# Patient Record
Sex: Male | Born: 2000 | Race: Black or African American | Hispanic: No | Marital: Single | Smoking: Never smoker
Health system: Southern US, Community
[De-identification: ages and names within clinical notes are randomized; demographics above are authoritative.]

## PROBLEM LIST (undated history)

## (undated) DIAGNOSIS — Z9109 Other allergy status, other than to drugs and biological substances: Secondary | ICD-10-CM

## (undated) DIAGNOSIS — T7840XA Allergy, unspecified, initial encounter: Secondary | ICD-10-CM

## (undated) HISTORY — DX: Allergy, unspecified, initial encounter: T78.40XA

---
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
## 2019-10-20 ENCOUNTER — Other Ambulatory Visit: Payer: Self-pay

## 2019-10-20 ENCOUNTER — Ambulatory Visit
Admission: EM | Admit: 2019-10-20 | Discharge: 2019-10-20 | Disposition: A | Discharge status: Home / Self Care | Payer: 59 | Attending: Emergency Medicine | Admitting: Emergency Medicine

## 2019-10-20 DIAGNOSIS — Z113 Encounter for screening for infections with a predominantly sexual mode of transmission: Secondary | ICD-10-CM | POA: Insufficient documentation

## 2019-10-20 NOTE — ED Triage Notes (Signed)
Pt had unprotected sex yesterday and is requesting STD testing

## 2019-10-20 NOTE — Discharge Instructions (Addendum)
Penile self swab or Urine cytology obtained  Take medications as prescribed and to completion We will follow up with you regarding the results of your test If tests are positive, please abstain from sexual activity until you and your partner(s) are treated Follow up with PCP or [facility] if symptoms persists Return here or go to ER if you have any new or worsening symptoms

## 2019-10-20 NOTE — ED Provider Notes (Addendum)
[facility]   [id number] 10/20/19 Arrival Time: 1300   BJ:[id number] FOR STD  SUBJECTIVE:  Jake Gardner is a 19 y.o. male who presents requesting STI screening.  Currently asymptomatic.  Partner asymptomatic.  Last unprotected sexual encounter 10/19/2019.  Sexually active with 1 male partner.  Denies similar symptoms in the past.  Denies fever, chills, nausea, vomiting, abdominal or pelvic pain, urinary symptoms, penile discharge, rash or lesions.    No LMP for male patient.  ROS: As per HPI.  All other pertinent ROS negative.     Past Medical History:  Diagnosis Date  . Allergy   . Environmental allergies    History reviewed. No pertinent surgical history. No Known Allergies No current facility-administered medications on file prior to encounter.   No current outpatient medications on file prior to encounter.   Social History   Socioeconomic History  . Marital status: Single    Spouse name: Not on file  . Number of children: Not on file  . Years of education: Not on file  . Highest education level: Not on file  Occupational History  . Not on file  Tobacco Use  . Smoking status: Never Smoker  Substance and Sexual Activity  . Alcohol use: No  . Drug use: No  . Sexual activity: Never  Other Topics Concern  . Not on file  Social History Narrative   Lives with parents, rising freshman at [employer]. Likes to swim and play games on computer. Has best friend Dorothea Ogle but mostly sees him at school. Does not like to invite friends to his house, would rather go to their house. Mom allows him to go to friends' house but not much other freedom.   CRAFFT NEG -- denies every riding in a car with someone who has been drinking.   Social Determinants of Health   Financial Resource Strain:   . Difficulty of Paying Living Expenses:   Food Insecurity:   . Worried About Charity fundraiser in the Last Year:   . Arboriculturist in the Last Year:   Transportation  Needs:   . Film/video editor (Medical):   Marland Kitchen Lack of Transportation (Non-Medical):   Physical Activity:   . Days of Exercise per Week:   . Minutes of Exercise per Session:   Stress:   . Feeling of Stress :   Social Connections:   . Frequency of Communication with Friends and Family:   . Frequency of Social Gatherings with Friends and Family:   . Attends Religious Services:   . Active Member of Clubs or Organizations:   . Attends Archivist Meetings:   Marland Kitchen Marital Status:   Intimate Partner Violence:   . Fear of Current or Ex-Partner:   . Emotionally Abused:   Marland Kitchen Physically Abused:   . Sexually Abused:    Family History  Problem Relation Age of Onset  . Cancer Maternal Grandmother        uterine  . Hypertension Maternal Grandmother   . Heart disease Maternal Grandmother   . Cancer Maternal Grandfather        colon  . Multiple sclerosis Paternal Grandmother   . Diabetes Paternal Grandfather   . Alcohol abuse Paternal Grandfather   . Asthma Neg Hx   . Depression Neg Hx   . Hyperlipidemia Neg Hx   . Mental illness Neg Hx   . Learning disabilities Neg Hx   . Stroke Neg Hx     OBJECTIVE:  Vitals:   10/20/19 1314  BP: (!) 147/73  Pulse: (!) 102  Resp: 16  Temp: 98 F (36.7 C)  SpO2: 98%     General appearance: alert, NAD, appears stated age Head: NCAT Throat: lips, mucosa, and tongue normal; teeth and gums normal Lungs: CTA bilaterally without adventitious breath sounds Heart: regular rate and rhythm.  Radial pulses 2+ symmetrical bilaterally Back: no CVA tenderness Abdomen: soft, non-tender; bowel sounds normal; no masses or organomegaly; no guarding or rebound tenderness GU: Penile self swab was completed Skin: warm and dry Psychological:  Alert and cooperative. Normal mood and affect.  LABS:  Results for orders placed or performed in visit on 11/23/14  GC/chlamydia probe amp, urine  Result Value Ref Range   Chlamydia, Swab/Urine, PCR NEGATIVE  NEGATIVE   GC Probe Amp, Urine NEGATIVE NEGATIVE    Labs Reviewed - No data to display  ASSESSMENT & PLAN:  1. Screening for STD (sexually transmitted disease)     No orders of the defined types were placed in this encounter.   Pending: Labs Reviewed - No data to display  Discharge instruction Penile self swab or Urine cytology obtained  Take medications as prescribed and to completion We will follow up with you regarding the results of your test If tests are positive, please abstain from sexual activity until you and your partner(s) are treated Follow up with PCP or [facility] if symptoms persists Return here or go to ER if you have any new or worsening symptoms    Reviewed expectations re: course of current medical issues. Questions answered. Outlined signs and symptoms indicating need for more acute intervention. Patient verbalized understanding. After Visit Summary given.       Durward Parcel, FNP 10/20/19 1350    Durward Parcel, FNP 10/20/19 1353

## 2019-10-21 LAB — RPR: RPR Ser Ql: NONREACTIVE

## 2019-10-21 LAB — CYTOLOGY, (ORAL, ANAL, URETHRAL) ANCILLARY ONLY
Chlamydia: NEGATIVE
Comment: NEGATIVE
Comment: NEGATIVE
Comment: NORMAL
Neisseria Gonorrhea: NEGATIVE
Trichomonas: NEGATIVE

## 2019-10-21 LAB — HIV ANTIBODY (ROUTINE TESTING W REFLEX): HIV Screen 4th Generation wRfx: NONREACTIVE

## 2020-05-11 ENCOUNTER — Emergency Department
Admission: EM | Admit: 2020-05-11 | Discharge: 2020-05-11 | Disposition: A | Discharge status: Home / Self Care | Payer: 59 | Attending: Student in an Organized Health Care Education/Training Program | Admitting: Student in an Organized Health Care Education/Training Program

## 2020-05-11 ENCOUNTER — Emergency Department: Payer: 59

## 2020-05-11 ENCOUNTER — Other Ambulatory Visit: Payer: Self-pay

## 2020-05-11 DIAGNOSIS — R519 Headache, unspecified: Secondary | ICD-10-CM | POA: Insufficient documentation

## 2020-05-11 MED ORDER — MELOXICAM 15 MG PO TABS: 15.0000 mg | ORAL_TABLET | Freq: Every day | ORAL | 2 refills | Status: DC

## 2020-05-11 MED ORDER — METHOCARBAMOL 500 MG PO TABS: 500.0000 mg | ORAL_TABLET | Freq: Three times a day (TID) | ORAL | 0 refills | Status: AC | PRN

## 2020-05-11 NOTE — Discharge Instructions (Signed)
Take Meloxicam and Robaxin as directed.  
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
## 2020-08-03 DIAGNOSIS — Z419 Encounter for procedure for purposes other than remedying health state, unspecified: Secondary | ICD-10-CM | POA: Diagnosis not present

## 2020-09-03 DIAGNOSIS — Z419 Encounter for procedure for purposes other than remedying health state, unspecified: Secondary | ICD-10-CM | POA: Diagnosis not present

## 2020-10-03 DIAGNOSIS — Z419 Encounter for procedure for purposes other than remedying health state, unspecified: Secondary | ICD-10-CM | POA: Diagnosis not present

## 2020-11-03 DIAGNOSIS — Z419 Encounter for procedure for purposes other than remedying health state, unspecified: Secondary | ICD-10-CM | POA: Diagnosis not present

## 2020-12-03 DIAGNOSIS — Z419 Encounter for procedure for purposes other than remedying health state, unspecified: Secondary | ICD-10-CM | POA: Diagnosis not present

## 2021-01-03 DIAGNOSIS — Z419 Encounter for procedure for purposes other than remedying health state, unspecified: Secondary | ICD-10-CM | POA: Diagnosis not present

## 2021-02-03 DIAGNOSIS — Z419 Encounter for procedure for purposes other than remedying health state, unspecified: Secondary | ICD-10-CM | POA: Diagnosis not present

## 2021-03-05 DIAGNOSIS — Z419 Encounter for procedure for purposes other than remedying health state, unspecified: Secondary | ICD-10-CM | POA: Diagnosis not present

## 2021-03-15 ENCOUNTER — Encounter: Payer: Self-pay | Admitting: Emergency Medicine

## 2021-03-15 ENCOUNTER — Ambulatory Visit
Admission: EM | Admit: 2021-03-15 | Discharge: 2021-03-15 | Disposition: A | Discharge status: Home / Self Care | Payer: Medicaid Other | Attending: Internal Medicine | Admitting: Internal Medicine

## 2021-03-15 ENCOUNTER — Other Ambulatory Visit: Payer: Self-pay

## 2021-03-15 DIAGNOSIS — Z113 Encounter for screening for infections with a predominantly sexual mode of transmission: Secondary | ICD-10-CM | POA: Diagnosis not present

## 2021-03-15 NOTE — ED Provider Notes (Signed)
[facility]    CSN: [id number] Arrival date & time: 03/15/21  1751      History   Chief Complaint No chief complaint on file.   HPI Jake Gardner is a 20 y.o. male comes to the urgent care for STD screening.  Patient has no symptoms.  Patient has multiple sexual partners.  HPI  Past Medical History:  Diagnosis Date   Allergy    Environmental allergies     Patient Active Problem List   Diagnosis Date Noted   Allergic rhinitis 11/17/2013    History reviewed. No pertinent surgical history.     Home Medications    Prior to Admission medications   Not on File    Family History Family History  Problem Relation Age of Onset   Cancer Maternal Grandmother        uterine   Hypertension Maternal Grandmother    Heart disease Maternal Grandmother    Cancer Maternal Grandfather        colon   Multiple sclerosis Paternal Grandmother    Diabetes Paternal Grandfather    Alcohol abuse Paternal Grandfather    Asthma Neg Hx    Depression Neg Hx    Hyperlipidemia Neg Hx    Mental illness Neg Hx    Learning disabilities Neg Hx    Stroke Neg Hx     Social History Social History   Tobacco Use   Smoking status: Never   Smokeless tobacco: Never  Substance Use Topics   Alcohol use: No   Drug use: No     Allergies   Patient has no known allergies.   Review of Systems Review of Systems  All other systems reviewed and are negative.   Physical Exam Triage Vital Signs ED Triage Vitals  Enc Vitals Group     BP 03/15/21 1856 120/69     Pulse Rate 03/15/21 1856 88     Resp 03/15/21 1856 18     Temp 03/15/21 1856 98.9 F (37.2 C)     Temp Source 03/15/21 1856 Oral     SpO2 03/15/21 1856 96 %     Weight --      Height --      Head Circumference --      Peak Flow --      Pain Score 03/15/21 1855 0     Pain Loc --      Pain Edu? --      Excl. in GC? --    No data found.  Updated Vital Signs BP 120/69 (BP Location: Right Arm)   Pulse  88   Temp 98.9 F (37.2 C) (Oral)   Resp 18   SpO2 96%   Visual Acuity Right Eye Distance:   Left Eye Distance:   Bilateral Distance:    Right Eye Near:   Left Eye Near:    Bilateral Near:     Physical Exam Vitals and nursing note reviewed.  Constitutional:      General: He is not in acute distress.    Appearance: Normal appearance. He is not ill-appearing.  HENT:     Right Ear: Tympanic membrane normal.     Left Ear: Tympanic membrane normal.  Cardiovascular:     Rate and Rhythm: Normal rate and regular rhythm.  Pulmonary:     Effort: Pulmonary effort is normal.     Breath sounds: Normal breath sounds. No wheezing or rhonchi.  Neurological:     Mental Status: He is alert.  UC Treatments / Results  Labs (all labs ordered are listed, but only abnormal results are displayed) Labs Reviewed  RPR  HIV ANTIBODY (ROUTINE TESTING W REFLEX)  CYTOLOGY, (ORAL, ANAL, URETHRAL) ANCILLARY ONLY    EKG   Radiology No results found.  Procedures Procedures (including critical care time)  Medications Ordered in UC Medications - No data to display  Initial Impression / Assessment and Plan / UC Course  I have reviewed the triage vital signs and the nursing notes.  Pertinent labs & imaging results that were available during my care of the patient were reviewed by me and considered in my medical decision making (see chart for details).     1.  STD screening Cytology for GC/chlamydia/trichomonas HIV/RPR We will call patient with recommendations if labs are abnormal. Final Clinical Impressions(s) / UC Diagnoses   Final diagnoses:  Screen for STD (sexually transmitted disease)     Discharge Instructions      We will call you with recommendations if labs are abnormal.   ED Prescriptions   None    PDMP not reviewed this encounter.   Merrilee Jansky, MD 03/15/21 [phone]

## 2021-03-15 NOTE — Discharge Instructions (Addendum)
We will call you with recommendations if labs are abnormal. 

## 2021-03-15 NOTE — ED Triage Notes (Signed)
Wants STD check 

## 2021-03-16 LAB — CYTOLOGY, (ORAL, ANAL, URETHRAL) ANCILLARY ONLY
Chlamydia: NEGATIVE
Comment: NEGATIVE
Comment: NEGATIVE
Comment: NORMAL
Neisseria Gonorrhea: NEGATIVE
Trichomonas: NEGATIVE

## 2021-03-16 LAB — HIV ANTIBODY (ROUTINE TESTING W REFLEX): HIV Screen 4th Generation wRfx: NONREACTIVE

## 2021-03-16 LAB — RPR: RPR Ser Ql: NONREACTIVE

## 2021-04-05 DIAGNOSIS — Z419 Encounter for procedure for purposes other than remedying health state, unspecified: Secondary | ICD-10-CM | POA: Diagnosis not present

## 2021-05-05 DIAGNOSIS — Z419 Encounter for procedure for purposes other than remedying health state, unspecified: Secondary | ICD-10-CM | POA: Diagnosis not present

## 2021-06-05 DIAGNOSIS — Z419 Encounter for procedure for purposes other than remedying health state, unspecified: Secondary | ICD-10-CM | POA: Diagnosis not present

## 2021-07-06 DIAGNOSIS — Z419 Encounter for procedure for purposes other than remedying health state, unspecified: Secondary | ICD-10-CM | POA: Diagnosis not present

## 2021-08-03 DIAGNOSIS — Z419 Encounter for procedure for purposes other than remedying health state, unspecified: Secondary | ICD-10-CM | POA: Diagnosis not present

## 2021-09-03 DIAGNOSIS — Z419 Encounter for procedure for purposes other than remedying health state, unspecified: Secondary | ICD-10-CM | POA: Diagnosis not present

## 2021-09-15 ENCOUNTER — Ambulatory Visit
Admission: EM | Admit: 2021-09-15 | Discharge: 2021-09-15 | Disposition: A | Discharge status: Home / Self Care | Payer: 59 | Attending: Family Medicine | Admitting: Family Medicine

## 2021-09-15 DIAGNOSIS — Z113 Encounter for screening for infections with a predominantly sexual mode of transmission: Secondary | ICD-10-CM | POA: Insufficient documentation

## 2021-09-15 NOTE — ED Triage Notes (Signed)
Pt stats he would like to be  tested for HIV, Herpes and chlamydia today ? ?Pt states he is not experiencing any symptoms ?  ?

## 2021-09-15 NOTE — ED Provider Notes (Signed)
?[facility] ? ? ? ?CSN: [id number] ?Arrival date & time: 09/15/21  1611 ? ? ?  ? ?History   ?Chief Complaint ?Chief Complaint  ?Patient presents with  ? Exposure to STD  ? ? ?HPI ?Jake Gardner is a 21 y.o. male.  ? ?He isPresenting today requesting screening for STIs.  States he has not had any new sexual partners or known exposures and is currently asymptomatic. ? ? ?Past Medical History:  ?Diagnosis Date  ? Allergy   ? Environmental allergies   ? ? ?Patient Active Problem List  ? Diagnosis Date Noted  ? Allergic rhinitis 11/17/2013  ? ? ?History reviewed. No pertinent surgical history. ? ? ? ? ?Home Medications   ? ?Prior to Admission medications   ?Not on File  ? ? ?Family History ?Family History  ?Problem Relation Age of Onset  ? Cancer Maternal Grandmother   ?     uterine  ? Hypertension Maternal Grandmother   ? Heart disease Maternal Grandmother   ? Cancer Maternal Grandfather   ?     colon  ? Multiple sclerosis Paternal Grandmother   ? Diabetes Paternal Grandfather   ? Alcohol abuse Paternal Grandfather   ? Asthma Neg Hx   ? Depression Neg Hx   ? Hyperlipidemia Neg Hx   ? Mental illness Neg Hx   ? Learning disabilities Neg Hx   ? Stroke Neg Hx   ? ? ?Social History ?Social History  ? ?Tobacco Use  ? Smoking status: Never  ?  Passive exposure: Never  ? Smokeless tobacco: Never  ?Vaping Use  ? Vaping Use: Never used  ?Substance Use Topics  ? Alcohol use: Yes  ?  Comment: occas  ? Drug use: No  ? ? ? ?Allergies   ?Patient has no known allergies. ? ? ?Review of Systems ?Review of Systems ?Per HPI ? ?Physical Exam ?Triage Vital Signs ?ED Triage Vitals  ?Enc Vitals Group  ?   BP 09/15/21 1650 (!) 147/71  ?   Pulse Rate 09/15/21 1650 (!) 103  ?   Resp 09/15/21 1650 18  ?   Temp 09/15/21 1650 99.5 ?F (37.5 ?C)  ?   Temp Source 09/15/21 1650 Oral  ?   SpO2 09/15/21 1650 97 %  ?   Weight --   ?   Height --   ?   Head Circumference --   ?   Peak Flow --   ?   Pain Score 09/15/21 1649 0  ?   Pain Loc --    ?   Pain Edu? --   ?   Excl. in GC? --   ? ?No data found. ? ?Updated Vital Signs ?BP (!) 147/71 (BP Location: Right Arm)   Pulse (!) 103   Temp 99.5 ?F (37.5 ?C) (Oral)   Resp 18   SpO2 97%  ? ?Visual Acuity ?Right Eye Distance:   ?Left Eye Distance:   ?Bilateral Distance:   ? ?Right Eye Near:   ?Left Eye Near:    ?Bilateral Near:    ? ?Physical Exam ?Vitals and nursing note reviewed.  ?Constitutional:   ?   Appearance: Normal appearance.  ?HENT:  ?   Head: Atraumatic.  ?Eyes:  ?   Extraocular Movements: Extraocular movements intact.  ?   Conjunctiva/sclera: Conjunctivae normal.  ?Cardiovascular:  ?   Rate and Rhythm: Normal rate and regular rhythm.  ?Pulmonary:  ?   Effort: Pulmonary effort is normal.  ?  Breath sounds: Normal breath sounds.  ?Abdominal:  ?   General: Bowel sounds are normal. There is no distension.  ?   Palpations: Abdomen is soft.  ?   Tenderness: There is no abdominal tenderness. There is no guarding.  ?Genitourinary: ?   Comments: GU exam deferred, self swab performed ?Musculoskeletal:     ?   General: Normal range of motion.  ?   Cervical back: Normal range of motion and neck supple.  ?Skin: ?   General: Skin is warm and dry.  ?Neurological:  ?   General: No focal deficit present.  ?   Mental Status: He is oriented to person, place, and time.  ?Psychiatric:     ?   Mood and Affect: Mood normal.     ?   Thought Content: Thought content normal.     ?   Judgment: Judgment normal.  ? ? ? ?UC Treatments / Results  ?Labs ?(all labs ordered are listed, but only abnormal results are displayed) ?Labs Reviewed  ?HIV ANTIBODY (ROUTINE TESTING W REFLEX)  ?RPR  ?CYTOLOGY, (ORAL, ANAL, URETHRAL) ANCILLARY ONLY  ? ? ?EKG ? ? ?Radiology ?No results found. ? ?Procedures ?Procedures (including critical care time) ? ?Medications Ordered in UC ?Medications - No data to display ? ?Initial Impression / Assessment and Plan / UC Course  ?I have reviewed the triage vital signs and the nursing  notes. ? ?Pertinent labs & imaging results that were available during my care of the patient were reviewed by me and considered in my medical decision making (see chart for details). ? ?  ? ?Vital signs reassuring, penile swab and HIV and syphilis labs all pending.  Treat as needed.  Safe sexual practices and abstinence until lab results return reviewed. ? ?Final Clinical Impressions(s) / UC Diagnoses  ? ?Final diagnoses:  ?Screening for STD (sexually transmitted disease)  ? ?Discharge Instructions   ?None ?  ? ?ED Prescriptions   ?None ?  ? ?PDMP not reviewed this encounter. ?  ?Particia Nearing, PA-C ?09/15/21 1740 ? ?

## 2021-09-16 LAB — CYTOLOGY, (ORAL, ANAL, URETHRAL) ANCILLARY ONLY
Chlamydia: NEGATIVE
Comment: NEGATIVE
Comment: NEGATIVE
Comment: NORMAL
Neisseria Gonorrhea: NEGATIVE
Trichomonas: NEGATIVE

## 2021-09-16 LAB — RPR: RPR Ser Ql: NONREACTIVE

## 2021-09-16 LAB — HIV ANTIBODY (ROUTINE TESTING W REFLEX): HIV Screen 4th Generation wRfx: NONREACTIVE

## 2021-10-03 DIAGNOSIS — Z419 Encounter for procedure for purposes other than remedying health state, unspecified: Secondary | ICD-10-CM | POA: Diagnosis not present

## 2021-11-03 DIAGNOSIS — Z419 Encounter for procedure for purposes other than remedying health state, unspecified: Secondary | ICD-10-CM | POA: Diagnosis not present
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
## 2022-08-20 ENCOUNTER — Ambulatory Visit
Admission: EM | Admit: 2022-08-20 | Discharge: 2022-08-20 | Disposition: A | Discharge status: Home / Self Care | Payer: 59 | Attending: Nurse Practitioner | Admitting: Nurse Practitioner

## 2022-08-20 DIAGNOSIS — Z113 Encounter for screening for infections with a predominantly sexual mode of transmission: Secondary | ICD-10-CM | POA: Diagnosis not present
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
## 2022-08-20 NOTE — ED Provider Notes (Signed)
[facility]    CSN: [id number] Arrival date & time: 08/20/22  1358      History   Chief Complaint Chief Complaint  Patient presents with   Exposure to STD    HPI Jake Gardner is a 22 y.o. male.   The history is provided by the patient.   Presents for STI/STD testing.  He denies any symptoms at this time.  Patient reports 1 male partner in the past 90 days.  Denies previous history of STI/STD.  Patient is requesting HIV/RPR testing.  Past Medical History:  Diagnosis Date   Allergy    Environmental allergies     Patient Active Problem List   Diagnosis Date Noted   Allergic rhinitis 11/17/2013    History reviewed. No pertinent surgical history.     Home Medications    Prior to Admission medications   Not on File    Family History Family History  Problem Relation Age of Onset   Cancer Maternal Grandmother        uterine   Hypertension Maternal Grandmother    Heart disease Maternal Grandmother    Cancer Maternal Grandfather        colon   Multiple sclerosis Paternal Grandmother    Diabetes Paternal Grandfather    Alcohol abuse Paternal Grandfather    Asthma Neg Hx    Depression Neg Hx    Hyperlipidemia Neg Hx    Mental illness Neg Hx    Learning disabilities Neg Hx    Stroke Neg Hx     Social History Social History   Tobacco Use   Smoking status: Never    Passive exposure: Never   Smokeless tobacco: Never  Vaping Use   Vaping Use: Never used  Substance Use Topics   Alcohol use: Yes    Comment: occas   Drug use: No     Allergies   Patient has no known allergies.   Review of Systems Review of Systems Per HPI  Physical Exam Triage Vital Signs ED Triage Vitals  Enc Vitals Group     BP 08/20/22 1509 (!) 142/74     Pulse Rate 08/20/22 1509 72     Resp 08/20/22 1509 14     Temp 08/20/22 1509 (!) 97.2 F (36.2 C)     Temp Source 08/20/22 1509 Oral     SpO2 08/20/22 1509 97 %     Weight --      Height --       Head Circumference --      Peak Flow --      Pain Score 08/20/22 1508 0     Pain Loc --      Pain Edu? --      Excl. in [place]? --    No data found.  Updated Vital Signs BP (!) 142/74 (BP Location: Right Arm)   Pulse 72   Temp (!) 97.2 F (36.2 C) (Oral)   Resp 14   SpO2 97%   Visual Acuity Right Eye Distance:   Left Eye Distance:   Bilateral Distance:    Right Eye Near:   Left Eye Near:    Bilateral Near:     Physical Exam Vitals and nursing note reviewed.  Constitutional:      General: He is not in acute distress.    Appearance: He is well-developed.  HENT:     Head: Normocephalic and atraumatic.  Eyes:     Extraocular Movements: Extraocular movements intact.  Pupils: Pupils are equal, round, and reactive to light.  Cardiovascular:     Rate and Rhythm: Normal rate and regular rhythm.     Pulses: Normal pulses.     Heart sounds: Normal heart sounds. No murmur heard. Pulmonary:     Effort: Pulmonary effort is normal. No respiratory distress.     Breath sounds: Normal breath sounds.  Abdominal:     General: Bowel sounds are normal.  Musculoskeletal:     Cervical back: Normal range of motion.  Skin:    General: Skin is warm and dry.     Capillary Refill: Capillary refill takes less than 2 seconds.  Neurological:     Mental Status: He is alert.  Psychiatric:        Mood and Affect: Mood normal.        Behavior: Behavior normal.      UC Treatments / Results  Labs (all labs ordered are listed, but only abnormal results are displayed) Labs Reviewed  HIV ANTIBODY (ROUTINE TESTING W REFLEX)  RPR  CYTOLOGY, (ORAL, ANAL, URETHRAL) ANCILLARY ONLY    EKG   Radiology No results found.  Procedures Procedures (including critical care time)  Medications Ordered in UC Medications - No data to display  Initial Impression / Assessment and Plan / UC Course  I have reviewed the triage vital signs and the nursing notes.  Pertinent labs & imaging results  that were available during my care of the patient were reviewed by me and considered in my medical decision making (see chart for details).  The patient is well-appearing, he is in no acute distress, vital signs are stable.  STI testing was performed with HIV/RPR.  Cytology results are pending at this time.  Patient advised that results will be available within the next 48 to 72 hours, he will also have access to his results via MyChart.  Patient advised to increase condom use.  Patient verbalizes understanding and is in agreement with this plan of care.  All questions were answered.  Patient stable for discharge.   Final Clinical Impressions(s) / UC Diagnoses   Final diagnoses:  Screening examination for sexually transmitted disease     Discharge Instructions      Your results should be available within the next 48 to 72 hours.  If you have access to MyChart, you will be able to see the results there.  If your results are positive, you will be contacted to discuss treatment. Increase condom use with each sexual encounter. Follow-up as needed.      ED Prescriptions   None    PDMP not reviewed this encounter.   Tish Men, NP 08/20/22 [phone]

## 2022-08-20 NOTE — Discharge Instructions (Signed)
Your results should be available within the next 48 to 72 hours.  If you have access to MyChart, you will be able to see the results there.  If your results are positive, you will be contacted to discuss treatment. Increase condom use with each sexual encounter. Follow-up as needed.  

## 2022-08-21 LAB — CYTOLOGY, (ORAL, ANAL, URETHRAL) ANCILLARY ONLY
Chlamydia: NEGATIVE
Comment: NEGATIVE
Comment: NEGATIVE
Comment: NORMAL
Neisseria Gonorrhea: NEGATIVE
Trichomonas: NEGATIVE

## 2022-08-22 LAB — HIV ANTIBODY (ROUTINE TESTING W REFLEX): HIV Screen 4th Generation wRfx: NONREACTIVE

## 2022-08-22 LAB — RPR: RPR Ser Ql: NONREACTIVE

## 2023-10-23 ENCOUNTER — Ambulatory Visit
Admission: EM | Admit: 2023-10-23 | Discharge: 2023-10-23 | Disposition: A | Discharge status: Home / Self Care | Attending: Nurse Practitioner | Admitting: Nurse Practitioner

## 2023-10-23 ENCOUNTER — Encounter: Payer: Self-pay | Admitting: Emergency Medicine

## 2023-10-23 DIAGNOSIS — Z113 Encounter for screening for infections with a predominantly sexual mode of transmission: Secondary | ICD-10-CM | POA: Diagnosis present

## 2023-10-23 NOTE — ED Triage Notes (Signed)
 Wants STD test done.

## 2023-10-23 NOTE — Discharge Instructions (Signed)
 Cytology test results and HIV/syphilis test results are pending.  You will be contacted if the pending test results are positive.  You will also have access to the results via MyChart. Increase condom use with each sexual encounter. Notify all sexual partners if test results are positive. If treatment is required, you will need to refrain from sexual intercourse for an additional 7 days after completing treatment. Follow-up as needed.

## 2023-10-23 NOTE — ED Provider Notes (Signed)
 [facility]    CSN: [id number] Arrival date & time: 10/23/23  4098      History   Chief Complaint No chief complaint on file.   HPI Jake Gardner is a 23 y.o. male.   The history is provided by the patient.   Patient presents for STI testing.  Patient denies symptoms to include penile discharge, dysuria, urinary frequency, hematuria, testicular/scrotal pain/swelling, or pelvic pain.  Patient reports 1 male partner in the past 90 days.  Denies prior history of STI/STD.  Past Medical History:  Diagnosis Date   Allergy    Environmental allergies     Patient Active Problem List   Diagnosis Date Noted   Allergic rhinitis 11/17/2013    History reviewed. No pertinent surgical history.     Home Medications    Prior to Admission medications   Not on File    Family History Family History  Problem Relation Age of Onset   Cancer Maternal Grandmother        uterine   Hypertension Maternal Grandmother    Heart disease Maternal Grandmother    Cancer Maternal Grandfather        colon   Multiple sclerosis Paternal Grandmother    Diabetes Paternal Grandfather    Alcohol abuse Paternal Grandfather    Asthma Neg Hx    Depression Neg Hx    Hyperlipidemia Neg Hx    Mental illness Neg Hx    Learning disabilities Neg Hx    Stroke Neg Hx     Social History Social History   Tobacco Use   Smoking status: Never    Passive exposure: Never   Smokeless tobacco: Never  Vaping Use   Vaping status: Never Used  Substance Use Topics   Alcohol use: Yes    Comment: occas   Drug use: No     Allergies   Patient has no known allergies.   Review of Systems Review of Systems Per HPI  Physical Exam Triage Vital Signs ED Triage Vitals  Encounter Vitals Group     BP 10/23/23 0842 (!) 144/68     Systolic BP Percentile --      Diastolic BP Percentile --      Pulse Rate 10/23/23 0842 (!) 52     Resp 10/23/23 0842 18     Temp 10/23/23 0842 98.3 F  (36.8 C)     Temp Source 10/23/23 0842 Oral     SpO2 10/23/23 0842 96 %     Weight --      Height --      Head Circumference --      Peak Flow --      Pain Score 10/23/23 0843 0     Pain Loc --      Pain Education --      Exclude from Growth Chart --    No data found.  Updated Vital Signs BP (!) 144/68 (BP Location: Right Arm)   Pulse (!) 52   Temp 98.3 F (36.8 C) (Oral)   Resp 18   SpO2 96%   Visual Acuity Right Eye Distance:   Left Eye Distance:   Bilateral Distance:    Right Eye Near:   Left Eye Near:    Bilateral Near:     Physical Exam Vitals and nursing note reviewed.  Constitutional:      General: He is not in acute distress.    Appearance: Normal appearance.  HENT:     Head: Normocephalic.  Eyes:     Pupils: Pupils are equal, round, and reactive to light.  Cardiovascular:     Rate and Rhythm: Normal rate and regular rhythm.     Pulses: Normal pulses.     Heart sounds: Normal heart sounds.  Pulmonary:     Effort: Pulmonary effort is normal.     Breath sounds: Normal breath sounds.  Abdominal:     General: Bowel sounds are normal.     Palpations: Abdomen is soft.  Genitourinary:    Comments: GU exam deferred, self swab performed  Musculoskeletal:     Cervical back: Normal range of motion.  Skin:    General: Skin is warm and dry.  Neurological:     General: No focal deficit present.     Mental Status: He is alert and oriented to person, place, and time.  Psychiatric:        Mood and Affect: Mood normal.        Behavior: Behavior normal.      UC Treatments / Results  Labs (all labs ordered are listed, but only abnormal results are displayed) Labs Reviewed - No data to display  EKG   Radiology No results found.  Procedures Procedures (including critical care time)  Medications Ordered in UC Medications - No data to display  Initial Impression / Assessment and Plan / UC Course  I have reviewed the triage vital signs and the  nursing notes.  Pertinent labs & imaging results that were available during my care of the patient were reviewed by me and considered in my medical decision making (see chart for details).  Patient presents for STI testing.  Patient is asymptomatic at this time.  Cytology results and HIV/RPR results are pending.  Supportive care recommendations were provided and discussed with the patient to include increasing condom use, notifying all sexual partners if testing is positive, and refraining from sexual intercourse if treatment is required..  Patient was in agreement with this plan of care and verbalizes understanding.  All questions were answered.  Patient stable for discharge.  Final Clinical Impressions(s) / UC Diagnoses   Final diagnoses:  None   Discharge Instructions   None    ED Prescriptions   None    PDMP not reviewed this encounter.   Hardy Lia, NP 10/23/23 [phone]

## 2023-10-24 LAB — HIV ANTIBODY (ROUTINE TESTING W REFLEX): HIV Screen 4th Generation wRfx: NONREACTIVE

## 2023-10-24 LAB — RPR: RPR Ser Ql: NONREACTIVE

## 2023-10-24 LAB — CYTOLOGY, (ORAL, ANAL, URETHRAL) ANCILLARY ONLY
Chlamydia: NEGATIVE
Comment: NEGATIVE
Comment: NEGATIVE
Comment: NORMAL
Neisseria Gonorrhea: NEGATIVE
Trichomonas: NEGATIVE

## 2024-06-17 ENCOUNTER — Ambulatory Visit
Admission: EM | Admit: 2024-06-17 | Discharge: 2024-06-17 | Disposition: A | Discharge status: Home / Self Care | Attending: Nurse Practitioner | Admitting: Nurse Practitioner

## 2024-06-17 DIAGNOSIS — Z113 Encounter for screening for infections with a predominantly sexual mode of transmission: Secondary | ICD-10-CM | POA: Diagnosis not present

## 2024-06-17 NOTE — ED Triage Notes (Addendum)
 Pt reports wanting STD testing. declined swab during triage. Pt is wanting just blood work. Denies any know exposure to STD's, had unprotected sex last night, Pt is aware that blood work only covers HIV and Syphilis.

## 2024-06-17 NOTE — ED Provider Notes (Signed)
 " [facility]    CSN: [id number] Arrival date & time: 06/17/24  1106      History   Chief Complaint No chief complaint on file.   HPI MICHAELL Gardner is a 24 y.o. male.   The history is provided by the patient.   Patient presents requesting testing for HIV and syphilis only.  He reports that he is currently not having any symptoms to include penile discharge, pelvic pain, dysuria, or scrotal/testicular pain/swelling.  He reports 1 male partner in the past 90 days.  Denies prior history of sexually transmitted infection.  Patient declined testing for chlamydia, gonorrhea, and trichomonas.  Past Medical History:  Diagnosis Date   Allergy    Environmental allergies     Patient Active Problem List   Diagnosis Date Noted   Allergic rhinitis 11/17/2013    History reviewed. No pertinent surgical history.     Home Medications    Prior to Admission medications  Not on File    Family History Family History  Problem Relation Age of Onset   Cancer Maternal Grandmother        uterine   Hypertension Maternal Grandmother    Heart disease Maternal Grandmother    Cancer Maternal Grandfather        colon   Multiple sclerosis Paternal Grandmother    Diabetes Paternal Grandfather    Alcohol abuse Paternal Grandfather    Asthma Neg Hx    Depression Neg Hx    Hyperlipidemia Neg Hx    Mental illness Neg Hx    Learning disabilities Neg Hx    Stroke Neg Hx     Social History Social History[1]   Allergies   Patient has no known allergies.   Review of Systems Review of Systems Per HPI  Physical Exam Triage Vital Signs ED Triage Vitals  Encounter Vitals Group     BP 06/17/24 1113 (!) 155/72     Girls Systolic BP Percentile --      Girls Diastolic BP Percentile --      Boys Systolic BP Percentile --      Boys Diastolic BP Percentile --      Pulse Rate 06/17/24 1113 94     Resp 06/17/24 1113 20     Temp 06/17/24 1113 97.9 F (36.6 C)     Temp  Source 06/17/24 1113 Oral     SpO2 06/17/24 1113 99 %     Weight --      Height --      Head Circumference --      Peak Flow --      Pain Score 06/17/24 1116 0     Pain Loc --      Pain Education --      Exclude from Growth Chart --    No data found.  Updated Vital Signs BP (!) 155/72 (BP Location: Right Arm)   Pulse 94   Temp 97.9 F (36.6 C) (Oral)   Resp 20   SpO2 99%   Visual Acuity Right Eye Distance:   Left Eye Distance:   Bilateral Distance:    Right Eye Near:   Left Eye Near:    Bilateral Near:     Physical Exam Vitals and nursing note reviewed.  Constitutional:      General: He is not in acute distress.    Appearance: Normal appearance.  HENT:     Head: Normocephalic.     Mouth/Throat:     Mouth: Mucous membranes  are moist.  Eyes:     Extraocular Movements: Extraocular movements intact.     Pupils: Pupils are equal, round, and reactive to light.  Cardiovascular:     Rate and Rhythm: Normal rate and regular rhythm.     Pulses: Normal pulses.     Heart sounds: Normal heart sounds.  Pulmonary:     Effort: Pulmonary effort is normal. No respiratory distress.     Breath sounds: Normal breath sounds. No stridor. No wheezing, rhonchi or rales.  Abdominal:     General: Bowel sounds are normal.     Palpations: Abdomen is soft.  Genitourinary:    Comments: GU exam deferred, self swab performed  Musculoskeletal:     Cervical back: Normal range of motion.  Skin:    General: Skin is warm and dry.  Neurological:     General: No focal deficit present.     Mental Status: He is alert and oriented to person, place, and time.  Psychiatric:        Mood and Affect: Mood normal.        Behavior: Behavior normal.      UC Treatments / Results  Labs (all labs ordered are listed, but only abnormal results are displayed) Labs Reviewed  HIV ANTIBODY (ROUTINE TESTING W REFLEX)  SYPHILIS: RPR W/REFLEX TO RPR TITER AND TREPONEMAL ANTIBODIES, TRADITIONAL SCREENING  AND DIAGNOSIS ALGORITHM    EKG   Radiology No results found.  Procedures Procedures (including critical care time)  Medications Ordered in UC Medications - No data to display  Initial Impression / Assessment and Plan / UC Course  I have reviewed the triage vital signs and the nursing notes.  Pertinent labs & imaging results that were available during my care of the patient were reviewed by me and considered in my medical decision making (see chart for details).  Lab results pending for HIV and syphilis.  Supportive care recommendations were provided and discussed with the patient to include notifying all partners of any positive test results, and refraining from sexual intercourse until test results are received.  Patient was advised to increase condom use with each sexual encounter.  Patient was in agreement with this plan of care and verbalizes understanding.  All questions were answered.  Patient stable for discharge.  Final Clinical Impressions(s) / UC Diagnoses   Final diagnoses:  Screening examination for sexually transmitted disease     Discharge Instructions      Lab results are pending.  You will be contacted if the pending test results are abnormal.  You will also have access to the results via MyChart. Refrain from sexual intercourse until test results are received. If your test results are positive, you will need to notify all partners. Increase condom use with each sexual encounter. Follow-up as needed.     ED Prescriptions   None    PDMP not reviewed this encounter.    [1]  Social History Tobacco Use   Smoking status: Never    Passive exposure: Never   Smokeless tobacco: Never  Vaping Use   Vaping status: Never Used  Substance Use Topics   Alcohol use: Yes    Comment: occas   Drug use: No     Gilmer Etta PARAS, NP 06/17/24 1145  "

## 2024-06-17 NOTE — Discharge Instructions (Signed)
 Lab results are pending.  You will be contacted if the pending test results are abnormal.  You will also have access to the results via MyChart. Refrain from sexual intercourse until test results are received. If your test results are positive, you will need to notify all partners. Increase condom use with each sexual encounter. Follow-up as needed.

## 2024-06-18 LAB — HIV ANTIBODY (ROUTINE TESTING W REFLEX): HIV Screen 4th Generation wRfx: NONREACTIVE

## 2024-06-18 LAB — SYPHILIS: RPR W/REFLEX TO RPR TITER AND TREPONEMAL ANTIBODIES, TRADITIONAL SCREENING AND DIAGNOSIS ALGORITHM: RPR Ser Ql: NONREACTIVE
